# Patient Record
Sex: Male | Born: 1990
Health system: Southern US, Community
[De-identification: ages and names within clinical notes are randomized; demographics above are authoritative.]

## PROBLEM LIST (undated history)

## (undated) ENCOUNTER — Ambulatory Visit (HOSPITAL_COMMUNITY): Payer: BC Managed Care – PPO

---

## 2016-03-06 ENCOUNTER — Emergency Department (HOSPITAL_BASED_OUTPATIENT_CLINIC_OR_DEPARTMENT_OTHER): Payer: Self-pay

## 2016-03-06 ENCOUNTER — Encounter (HOSPITAL_BASED_OUTPATIENT_CLINIC_OR_DEPARTMENT_OTHER): Payer: Self-pay

## 2016-03-06 ENCOUNTER — Emergency Department (HOSPITAL_BASED_OUTPATIENT_CLINIC_OR_DEPARTMENT_OTHER)
Admission: EM | Admit: 2016-03-06 | Discharge: 2016-03-07 | Disposition: A | Discharge status: Home / Self Care | Payer: Self-pay | Attending: Emergency Medicine | Admitting: Emergency Medicine

## 2016-03-06 DIAGNOSIS — F12921 Cannabis use, unspecified with intoxication delirium: Secondary | ICD-10-CM

## 2016-03-06 DIAGNOSIS — Z5181 Encounter for therapeutic drug level monitoring: Secondary | ICD-10-CM | POA: Insufficient documentation

## 2016-03-06 DIAGNOSIS — F12121 Cannabis abuse with intoxication delirium: Secondary | ICD-10-CM | POA: Insufficient documentation

## 2016-03-06 LAB — RAPID URINE DRUG SCREEN, HOSP PERFORMED
Amphetamines: NOT DETECTED
BARBITURATES: NOT DETECTED
Benzodiazepines: NOT DETECTED
COCAINE: NOT DETECTED
OPIATES: NOT DETECTED
Tetrahydrocannabinol: POSITIVE — AB

## 2016-03-06 MED ORDER — ONDANSETRON HCL 4 MG/2ML IJ SOLN
4.0000 mg | Freq: Once | INTRAMUSCULAR | Status: AC
  Administered 2016-03-07: 4 mg via INTRAVENOUS
  Filled 2016-03-06: qty 2

## 2016-03-06 NOTE — ED Notes (Signed)
Pt 's parents received a phone call from patient's girlfriend tonight around 2100.  She received a phone call from pt who stated that he was sick and vomiting blood.  Parents arrived at his apartment and he was throwing up his dinner, pale and sweaty and was altered.  Upon further investigation in triage, pt may have eaten food that was laced with marijuana or another drug as he told parents something about taking "THC," and told nurse that he was "seeing things."  Pt is pale and c/o nausea, responds to voice and follows commands, but has vague recollection of events earlier tonight.  Otherwise healthy and not known to do drugs.

## 2016-03-06 NOTE — ED Provider Notes (Signed)
CSN: [id number]     Arrival date & time 03/06/16  2207 History  By signing my name below, I, Majel HomerPeyton Lee, attest that this documentation has been prepared under the direction and in the presence of Paula LibraJohn Keylani Perlstein, MD . Electronically Signed: Majel HomerPeyton Lee, Scribe. 03/06/2016. 11:17 PM.   Chief Complaint  Patient presents with  . Altered Mental Status   The history is provided by the patient. No language interpreter was used.   Level V Caveat due to AMS   HPI Comments: Steven Rangel is a 25 y.o. male who presents to the Emergency Department with parents with a complaint altered mental status first noticed ~9:00pm this evening. His mother got a phone call from patient's girlfriend tonight who stated that pt was vomiting and "didn't sound like himself." His mother states that when she called the patient he was barely responsive and stated that he was vomiting up blood. His twin brother went to check up on him and reported that he did not see blood in his vomit; it appeared to look more like chicken. The patient's mother noted he looked pale when she first approached him. She states he was unresponsive to most of the questions she asked him. He told his father that his friends were abusing THC earlier and thinks they may have slipped it into something he ate or drank. He has not expressed any thoughts of depression or suicidality.  History reviewed. No pertinent past medical history. History reviewed. No pertinent past surgical history. No family history on file. Social History  Substance Use Topics  . Smoking status: None  . Smokeless tobacco: None  . Alcohol Use: None    Review of Systems  Unable to perform ROS: Mental status change   Allergies  Review of patient's allergies indicates no known allergies.  Home Medications   Prior to Admission medications   Not on File   BP 139/79 mmHg  Pulse 84  Temp(Src) 98.5 F (36.9 C) (Oral)  Resp 16  Ht 5\' 11"  (1.803 m)  Wt 252 lb (114.306 kg)   BMI 35.16 kg/m2  SpO2 100%   Physical Exam General: Well-developed, well-nourished male in no acute distress; appearance consistent with age of record HENT: normocephalic; atraumatic Eyes: pupils equal, round and reactive to light; extraocular muscles grossly intact Neck: supple Heart: regular rate and rhythm Lungs: clear to auscultation bilaterally Abdomen: soft; nondistended; nontender; no masses or hepatosplenomegaly; bowel sounds present Extremities: No deformity; full range of motion; pulses normal, noted to move all extremities. Neurologic: Motor function intact in all extremities and symmetric; no facial droop; somnolent but arousable to voice; slow to answer but oriented x3 Skin: Warm and dry Psychiatric: Flat affect  ED Course  Procedures    MDM   Nursing notes and vitals signs, including pulse oximetry, reviewed.  Summary of this visit's results, reviewed by myself:  Labs:  Results for orders placed or performed during the hospital encounter of 03/06/16 (from the past 24 hour(s))  Urine rapid drug screen (hosp performed)     Status: Abnormal   Collection Time: 03/06/16 11:33 PM  Result Value Ref Range   Opiates NONE DETECTED NONE DETECTED   Cocaine NONE DETECTED NONE DETECTED   Benzodiazepines NONE DETECTED NONE DETECTED   Amphetamines NONE DETECTED NONE DETECTED   Tetrahydrocannabinol POSITIVE (A) NONE DETECTED   Barbiturates NONE DETECTED NONE DETECTED  CBC with Differential/Platelet     Status: Abnormal   Collection Time: 03/07/16  1:35 AM  Result Value Ref  Range   WBC 16.4 (H) 4.0 - 10.5 K/uL   RBC 4.85 4.22 - 5.81 MIL/uL   Hemoglobin 14.8 13.0 - 17.0 g/dL   HCT 16.141.9 09.639.0 - 04.552.0 %   MCV 86.4 78.0 - 100.0 fL   MCH 30.5 26.0 - 34.0 pg   MCHC 35.3 30.0 - 36.0 g/dL   RDW 40.912.5 81.111.5 - 91.415.5 %   Platelets 256 150 - 400 K/uL   Neutrophils Relative % 89 %   Neutro Abs 14.6 (H) 1.7 - 7.7 K/uL   Lymphocytes Relative 7 %   Lymphs Abs 1.1 0.7 - 4.0 K/uL    Monocytes Relative 4 %   Monocytes Absolute 0.6 0.1 - 1.0 K/uL   Eosinophils Relative 0 %   Eosinophils Absolute 0.0 0.0 - 0.7 K/uL   Basophils Relative 0 %   Basophils Absolute 0.0 0.0 - 0.1 K/uL  Comprehensive metabolic panel     Status: None   Collection Time: 03/07/16  1:35 AM  Result Value Ref Range   Sodium 136 135 - 145 mmol/L   Potassium 4.3 3.5 - 5.1 mmol/L   Chloride 103 101 - 111 mmol/L   CO2 27 22 - 32 mmol/L   Glucose, Bld 98 65 - 99 mg/dL   BUN 12 6 - 20 mg/dL   Creatinine, Ser 7.821.12 0.61 - 1.24 mg/dL   Calcium 9.3 8.9 - 95.610.3 mg/dL   Total Protein 7.4 6.5 - 8.1 g/dL   Albumin 4.4 3.5 - 5.0 g/dL   AST 23 15 - 41 U/L   ALT 44 17 - 63 U/L   Alkaline Phosphatase 49 38 - 126 U/L   Total Bilirubin 0.8 0.3 - 1.2 mg/dL   GFR calc non Af Amer >60 >60 mL/min   GFR calc Af Amer >60 >60 mL/min   Anion gap 6 5 - 15  Ethanol     Status: None   Collection Time: 03/07/16  1:35 AM  Result Value Ref Range   Alcohol, Ethyl (B) <5 <5 mg/dL    Imaging Studies: Ct Head Wo Contrast  03/07/2016  CLINICAL DATA:  Altered level of consciousness. EXAM: CT HEAD WITHOUT CONTRAST TECHNIQUE: Contiguous axial images were obtained from the base of the skull through the vertex without intravenous contrast. COMPARISON:  None. FINDINGS: Ventricle size is normal. Negative for acute or chronic infarction. Negative for hemorrhage or fluid collection. Negative for mass or edema. No shift of the midline structures. Calvarium is intact. IMPRESSION: Negative Electronically Signed   By: Marlan Palauharles  Clark M.D.   On: 03/07/2016 00:02   2:08 AM Patient now more arousable and conversant. He and his parents were advised of lab findings consistent with acute THC intoxication.  Final diagnoses:  Intoxication with cannabis, with delirium (HCC)   I personally performed the services described in this documentation, which was scribed in my presence. The recorded information has been reviewed and is accurate.   Paula LibraJohn  Mayla Biddy, MD 03/07/16 [phone]

## 2016-03-07 LAB — CBC WITH DIFFERENTIAL/PLATELET
Basophils Absolute: 0 10*3/uL (ref 0.0–0.1)
Basophils Relative: 0 %
EOS ABS: 0 10*3/uL (ref 0.0–0.7)
Eosinophils Relative: 0 %
HEMATOCRIT: 41.9 % (ref 39.0–52.0)
HEMOGLOBIN: 14.8 g/dL (ref 13.0–17.0)
LYMPHS ABS: 1.1 10*3/uL (ref 0.7–4.0)
Lymphocytes Relative: 7 %
MCH: 30.5 pg (ref 26.0–34.0)
MCHC: 35.3 g/dL (ref 30.0–36.0)
MCV: 86.4 fL (ref 78.0–100.0)
MONO ABS: 0.6 10*3/uL (ref 0.1–1.0)
MONOS PCT: 4 %
NEUTROS ABS: 14.6 10*3/uL — AB (ref 1.7–7.7)
NEUTROS PCT: 89 %
Platelets: 256 10*3/uL (ref 150–400)
RBC: 4.85 MIL/uL (ref 4.22–5.81)
RDW: 12.5 % (ref 11.5–15.5)
WBC: 16.4 10*3/uL — ABNORMAL HIGH (ref 4.0–10.5)

## 2016-03-07 LAB — COMPREHENSIVE METABOLIC PANEL
ALK PHOS: 49 U/L (ref 38–126)
ALT: 44 U/L (ref 17–63)
ANION GAP: 6 (ref 5–15)
AST: 23 U/L (ref 15–41)
Albumin: 4.4 g/dL (ref 3.5–5.0)
BUN: 12 mg/dL (ref 6–20)
CALCIUM: 9.3 mg/dL (ref 8.9–10.3)
CO2: 27 mmol/L (ref 22–32)
Chloride: 103 mmol/L (ref 101–111)
Creatinine, Ser: 1.12 mg/dL (ref 0.61–1.24)
Glucose, Bld: 98 mg/dL (ref 65–99)
Potassium: 4.3 mmol/L (ref 3.5–5.1)
Sodium: 136 mmol/L (ref 135–145)
TOTAL PROTEIN: 7.4 g/dL (ref 6.5–8.1)
Total Bilirubin: 0.8 mg/dL (ref 0.3–1.2)

## 2016-03-07 LAB — ETHANOL

## 2017-05-20 IMAGING — CT CT HEAD W/O CM
3 series · 15 of 47 positions shown, 18 images · non-contrast
Comparison: None.

CLINICAL DATA: Altered level of consciousness.

EXAM:
CT HEAD WITHOUT CONTRAST
TECHNIQUE: Contiguous axial images were obtained from the base of the skull
through the vertex without intravenous contrast.

[Series 2: head wo · axial · 0.46mm/px · z∈[+1129,+1254]mm · 9 of 31 slices shown, 12 images]
[im 3/31  brain]
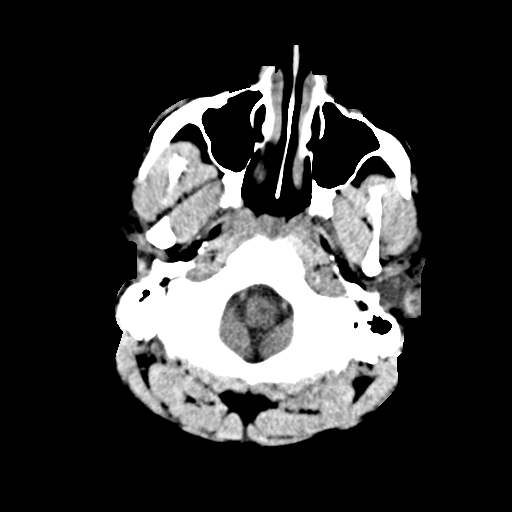
[im 3/31  bone]
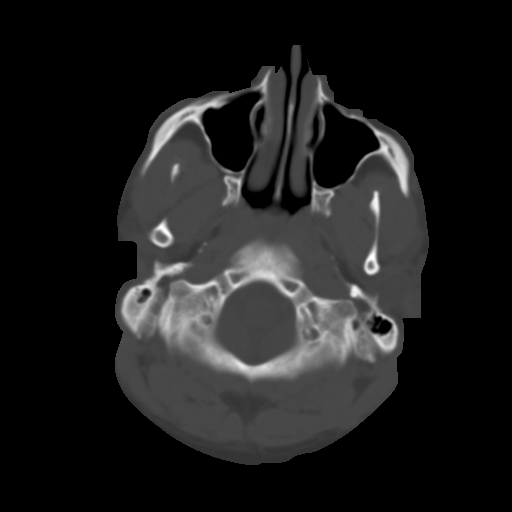
[im 6/31  brain]
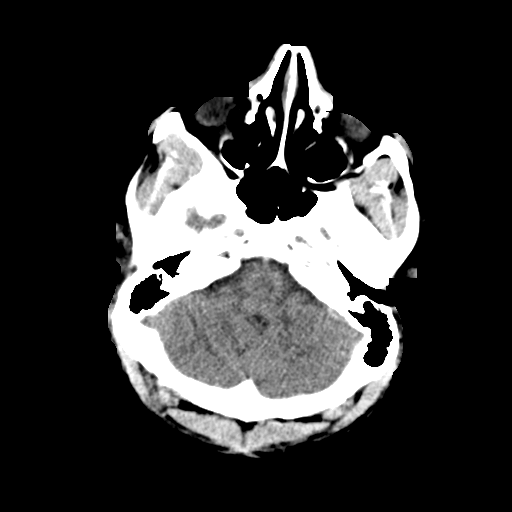
[im 9/31  brain]
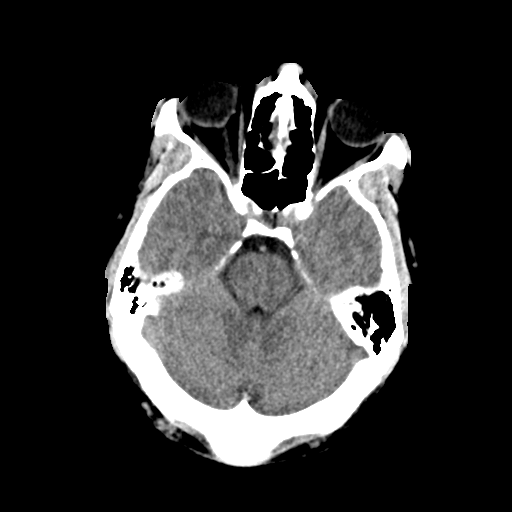
[im 12/31  brain]
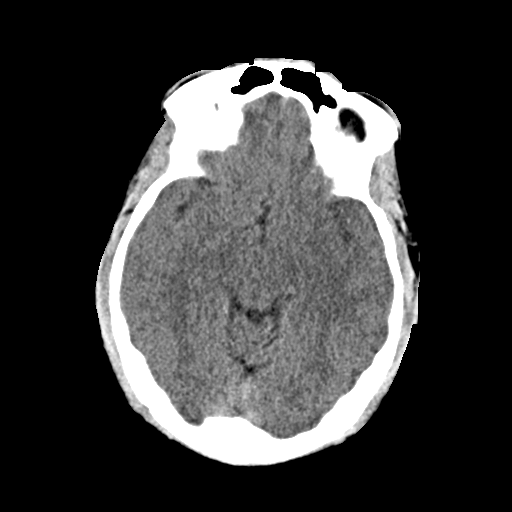
[im 16/31  brain]
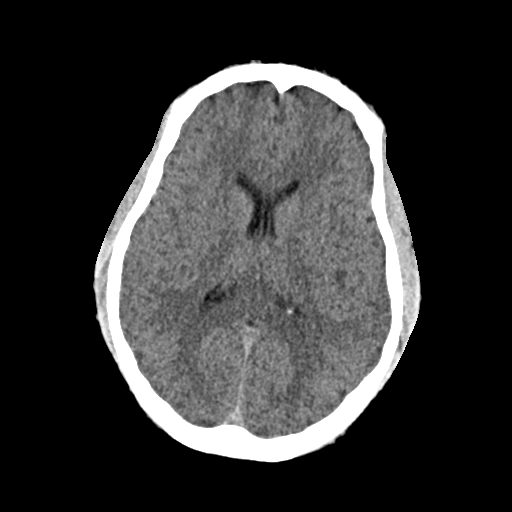
[im 16/31  bone]
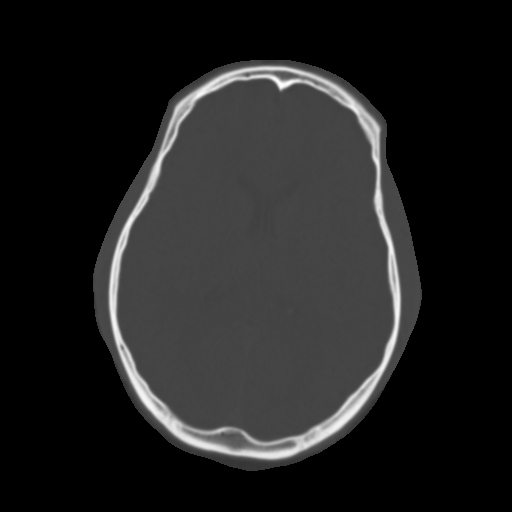
[im 19/31  brain]
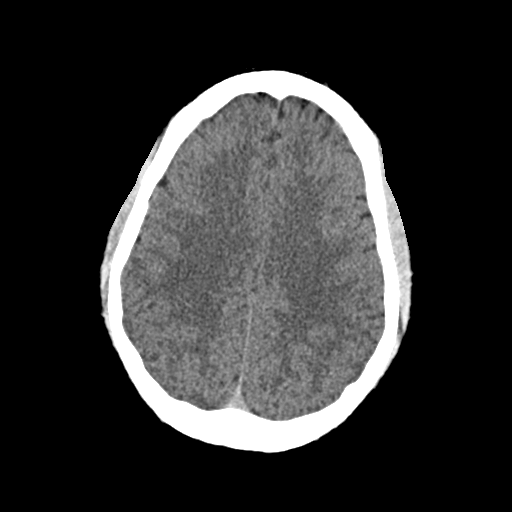
[im 22/31  brain]
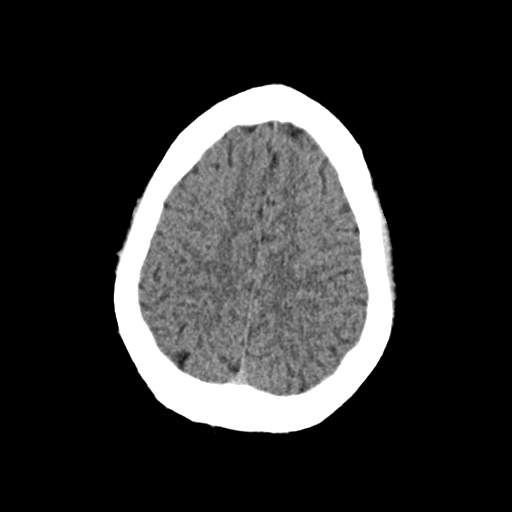
[im 25/31  brain]
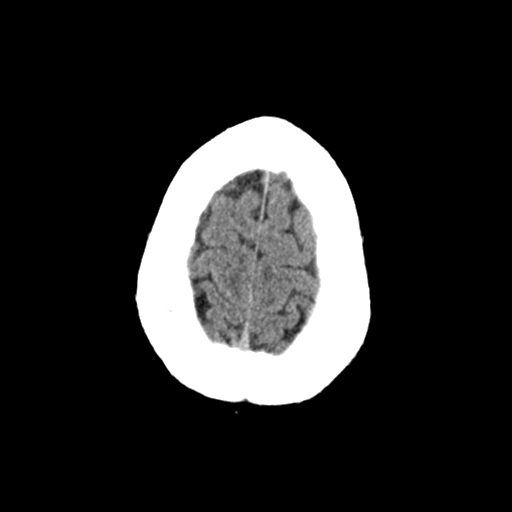
[im 28/31  brain]
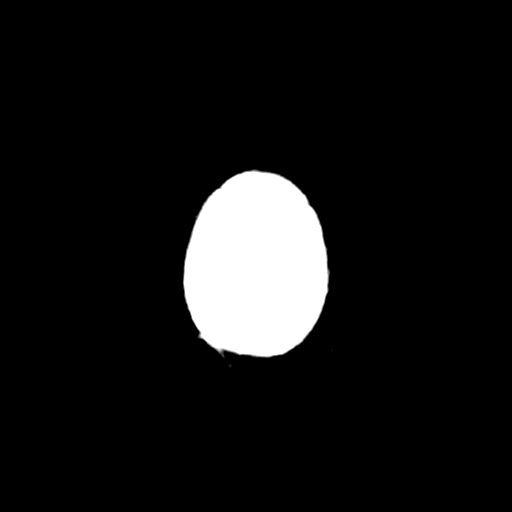
[im 28/31  bone]
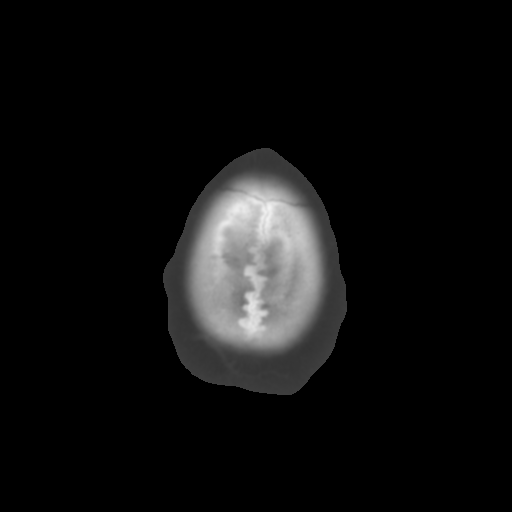

[Series 4: coronal soft · coronal · 0.30mm/px · 3 of 70 slices shown]
[im 24/70  brain]
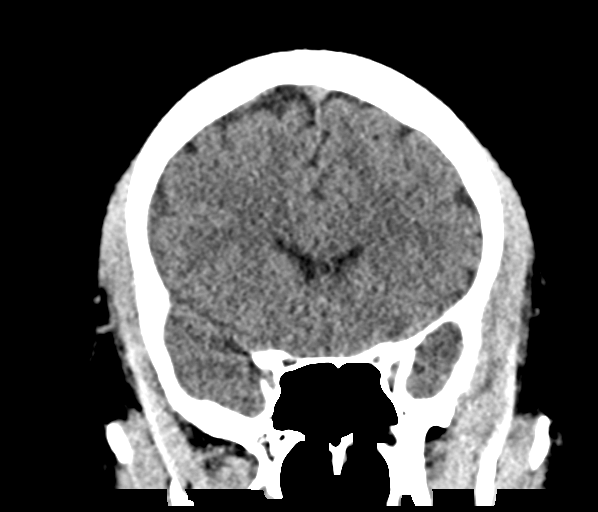
[im 31/70  brain]
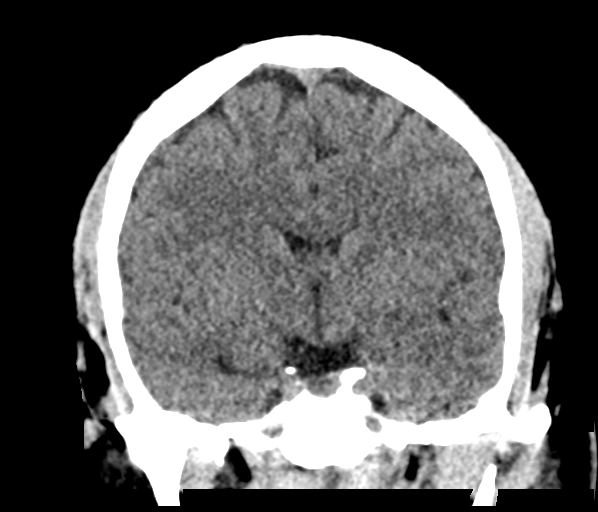
[im 39/70  brain]
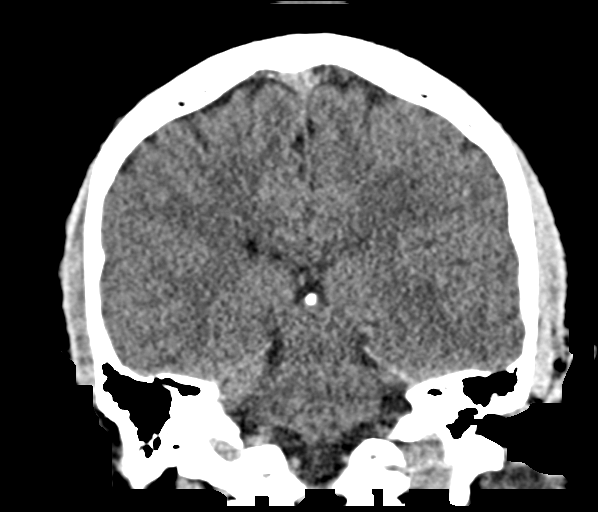

[Series 5: sag soft · sagittal · 0.33mm/px · 3 of 67 slices shown]
[im 23/67  brain]
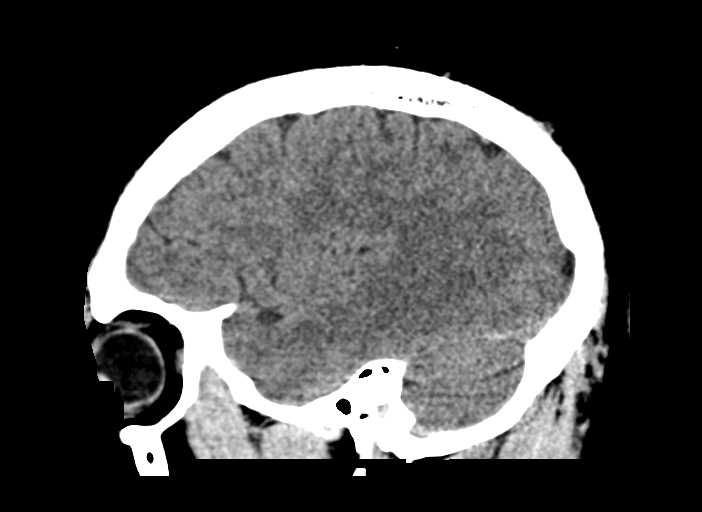
[im 34/67  brain]
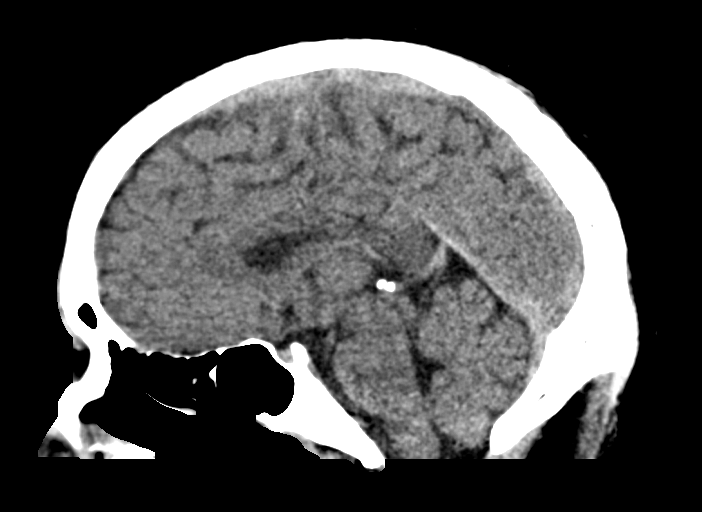
[im 45/67  brain]
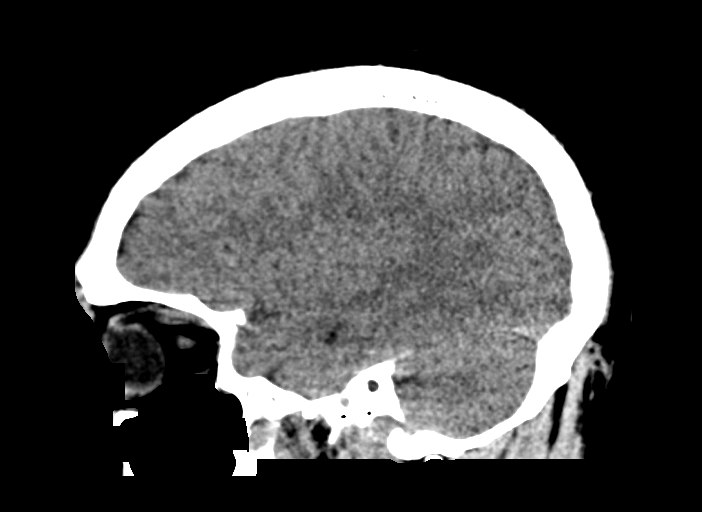

[15 of 47 positions shown; findings below may reference images not displayed]

FINDINGS: Ventricle size is normal. Negative for acute or chronic infarction.
Negative for hemorrhage or fluid collection. Negative for mass or
edema. No shift of the midline structures.

Calvarium is intact.
IMPRESSION: Negative

## 2018-10-20 ENCOUNTER — Other Ambulatory Visit: Payer: Self-pay

## 2018-10-20 ENCOUNTER — Encounter (HOSPITAL_BASED_OUTPATIENT_CLINIC_OR_DEPARTMENT_OTHER): Payer: Self-pay | Admitting: Emergency Medicine

## 2018-10-20 ENCOUNTER — Emergency Department (HOSPITAL_BASED_OUTPATIENT_CLINIC_OR_DEPARTMENT_OTHER)
Admission: EM | Admit: 2018-10-20 | Discharge: 2018-10-20 | Disposition: A | Discharge status: Home / Self Care | Payer: BLUE CROSS/BLUE SHIELD | Attending: Emergency Medicine | Admitting: Emergency Medicine

## 2018-10-20 ENCOUNTER — Emergency Department (HOSPITAL_BASED_OUTPATIENT_CLINIC_OR_DEPARTMENT_OTHER): Payer: BLUE CROSS/BLUE SHIELD

## 2018-10-20 DIAGNOSIS — Y939 Activity, unspecified: Secondary | ICD-10-CM | POA: Insufficient documentation

## 2018-10-20 DIAGNOSIS — S4991XA Unspecified injury of right shoulder and upper arm, initial encounter: Secondary | ICD-10-CM | POA: Diagnosis present

## 2018-10-20 DIAGNOSIS — Y929 Unspecified place or not applicable: Secondary | ICD-10-CM | POA: Insufficient documentation

## 2018-10-20 DIAGNOSIS — X500XXA Overexertion from strenuous movement or load, initial encounter: Secondary | ICD-10-CM | POA: Diagnosis not present

## 2018-10-20 DIAGNOSIS — S46911A Strain of unspecified muscle, fascia and tendon at shoulder and upper arm level, right arm, initial encounter: Secondary | ICD-10-CM | POA: Insufficient documentation

## 2018-10-20 DIAGNOSIS — Y999 Unspecified external cause status: Secondary | ICD-10-CM | POA: Insufficient documentation

## 2018-10-20 MED ORDER — METHOCARBAMOL 500 MG PO TABS: 500.0000 mg | ORAL_TABLET | Freq: Two times a day (BID) | ORAL | 0 refills | Status: AC

## 2018-10-20 MED FILL — METHOCARBAMOL 500 MG TABLET: 500 | 10 days supply | Qty: 20 | Fill #0

## 2018-10-20 NOTE — ED Provider Notes (Signed)
[facility] EMERGENCY DEPARTMENT Provider Note   CSN: [id number] Arrival date & time: 10/20/18  1141     History   Chief Complaint Chief Complaint  Patient presents with  . Shoulder Injury    HPI Gavyn Trombly is a 28 y.o. male who presents with right shoulder pain.  No significant past medical history.  The patient states that he was doing bench presses last night.  He states that he just started working out again after some time off.  When he was doing the bench press he felt acute onset of pain in the left shoulder area over the trapezius.  Pain is worse with movement of the arm.  Nothing makes it better.  He has not tried anything over-the-counter.  He has not had this problem before.  He is able to move his shoulder but is limited due to pain.  He has some numbness and tingling over the trapezius.  No radicular pain or severe neck pain.  No weakness of the arm  HPI  History reviewed. No pertinent past medical history.  There are no active problems to display for this patient.   History reviewed. No pertinent surgical history.      Home Medications    Prior to Admission medications   Not on File    Family History History reviewed. No pertinent family history.  Social History Social History   Tobacco Use  . Smoking status: Never Smoker  . Smokeless tobacco: Never Used  Substance Use Topics  . Alcohol use: Not on file  . Drug use: Not on file     Allergies   Patient has no known allergies.   Review of Systems Review of Systems  Musculoskeletal: Positive for arthralgias and myalgias.  Neurological: Positive for numbness. Negative for weakness.     Physical Exam Updated Vital Signs BP (!) 149/91 (BP Location: Left Arm)   Pulse 77   Temp 98.5 F (36.9 C) (Oral)   Resp 18   Ht 5\' 11"  (1.803 m)   Wt 106.6 kg   SpO2 100%   BMI 32.78 kg/m   Physical Exam Vitals signs and nursing note reviewed.  Constitutional:      General: He is  not in acute distress.    Appearance: Normal appearance. He is well-developed.  HENT:     Head: Normocephalic and atraumatic.  Eyes:     General: No scleral icterus.       Right eye: No discharge.        Left eye: No discharge.     Conjunctiva/sclera: Conjunctivae normal.     Pupils: Pupils are equal, round, and reactive to light.  Neck:     Musculoskeletal: Normal range of motion.  Cardiovascular:     Rate and Rhythm: Normal rate and regular rhythm.  Pulmonary:     Effort: Pulmonary effort is normal. No respiratory distress.     Breath sounds: Normal breath sounds.  Abdominal:     General: There is no distension.  Musculoskeletal:     Comments: Right shoulder: Right trapezius feels tight and swollen. There is some spasms with flexion of the shoulder. Tenderness to palpation over left trapezius. Mild tenderness over biceps tendon. No tenderness over lateral shoulder. Negative drop arm test. 5/5 grip strength. N/V intact.   Skin:    General: Skin is warm and dry.  Neurological:     Mental Status: He is alert and oriented to person, place, and time.  Psychiatric:  Behavior: Behavior normal.      ED Treatments / Results  Labs (all labs ordered are listed, but only abnormal results are displayed) Labs Reviewed - No data to display  EKG None  Radiology Dg Shoulder Right  Result Date: 10/20/2018 CLINICAL DATA:  Patient states that he was lifting weights early this morning and injured his right shoulder, having pains to entire right shoulder, no other complaints EXAM: RIGHT SHOULDER - 2+ VIEW COMPARISON:  None. FINDINGS: There is no evidence of fracture or dislocation. There is no evidence of arthropathy or other focal bone abnormality. Soft tissues are unremarkable. IMPRESSION: Negative. Electronically Signed   By: Amie Portland M.D.   On: 10/20/2018 12:27    Procedures Procedures (including critical care time)  Medications Ordered in ED Medications - No data to  display   Initial Impression / Assessment and Plan / ED Course  I have reviewed the triage vital signs and the nursing notes.  Pertinent labs & imaging results that were available during my care of the patient were reviewed by me and considered in my medical decision making (see chart for details).  28 year old male presents with right shoulder pain after lifting injury yesterday.  On exam he has full range of motion of the shoulder however he is having significant pain.  Suspect muscle strain.  His x-ray is negative.  Advised to rest, ice, heat, NSAIDs and will prescribe a course of muscle relaxers.  He was given sports medicine follow-up if he is not improving.  Final Clinical Impressions(s) / ED Diagnoses   Final diagnoses:  Strain of right shoulder, initial encounter    ED Discharge Orders    None       Bethel Born, PA-C 10/20/18 1340    Vanetta Mulders, MD 10/24/18 [phone]

## 2018-10-20 NOTE — ED Triage Notes (Signed)
Reports right shoulder injury while lifting weights yesterday.

## 2018-10-20 NOTE — Discharge Instructions (Addendum)
Rest - avoid working out or aggravating movements until you do not have pain Ice - for the first 24 hours for 20 minutes at a time and then switch to heat Ibuprofen - take 600mg  three times a day. Take with food Take muscle relaxer as needed for spasms. Do not drive or take while working Follow up with orthopedics if you are not improving in the next week

## 2018-10-20 NOTE — ED Notes (Signed)
Pt enrolled in aromatherapy pain trial 

## 2021-04-09 ENCOUNTER — Encounter (HOSPITAL_BASED_OUTPATIENT_CLINIC_OR_DEPARTMENT_OTHER): Payer: Self-pay | Admitting: Emergency Medicine

## 2021-04-09 ENCOUNTER — Emergency Department (HOSPITAL_BASED_OUTPATIENT_CLINIC_OR_DEPARTMENT_OTHER)
Admission: EM | Admit: 2021-04-09 | Discharge: 2021-04-09 | Disposition: A | Discharge status: Home / Self Care | Payer: BC Managed Care – PPO | Attending: Emergency Medicine | Admitting: Emergency Medicine

## 2021-04-09 ENCOUNTER — Other Ambulatory Visit: Payer: Self-pay

## 2021-04-09 ENCOUNTER — Other Ambulatory Visit (HOSPITAL_BASED_OUTPATIENT_CLINIC_OR_DEPARTMENT_OTHER): Payer: Self-pay

## 2021-04-09 DIAGNOSIS — L500 Allergic urticaria: Secondary | ICD-10-CM | POA: Insufficient documentation

## 2021-04-09 DIAGNOSIS — T63441A Toxic effect of venom of bees, accidental (unintentional), initial encounter: Secondary | ICD-10-CM | POA: Diagnosis not present

## 2021-04-09 MED ORDER — ONDANSETRON HCL 4 MG/2ML IJ SOLN
4.0000 mg | Freq: Once | INTRAMUSCULAR | Status: AC
  Administered 2021-04-09: 4 mg via INTRAVENOUS
  Filled 2021-04-09: qty 2

## 2021-04-09 MED ORDER — DIPHENHYDRAMINE HCL 50 MG/ML IJ SOLN
25.0000 mg | Freq: Once | INTRAMUSCULAR | Status: AC
  Administered 2021-04-09: 25 mg via INTRAVENOUS
  Filled 2021-04-09: qty 1

## 2021-04-09 MED ORDER — METHYLPREDNISOLONE SODIUM SUCC 125 MG IJ SOLR
125.0000 mg | Freq: Once | INTRAMUSCULAR | Status: AC
  Administered 2021-04-09: 125 mg via INTRAVENOUS
  Filled 2021-04-09: qty 2

## 2021-04-09 MED ORDER — ONDANSETRON 4 MG PO TBDP
4.0000 mg | ORAL_TABLET | Freq: Three times a day (TID) | ORAL | 0 refills | Status: AC | PRN
  Filled 2021-04-09: qty 15, 5d supply, fill #0

## 2021-04-09 MED ORDER — KETOROLAC TROMETHAMINE 30 MG/ML IJ SOLN
30.0000 mg | Freq: Once | INTRAMUSCULAR | Status: AC
  Administered 2021-04-09: 30 mg via INTRAVENOUS
  Filled 2021-04-09: qty 1

## 2021-04-09 NOTE — ED Provider Notes (Signed)
[facility] EMERGENCY DEPARTMENT Provider Note   CSN: [id number] Arrival date & time: 04/09/21  8657     History Chief Complaint  Patient presents with   Allergic Reaction    Steven Rangel is a 30 y.o. male presenting to the emergency department after being stung by hornets nest.  The patient ports his car around 930.  He said he was stung multiple times over the body by yellow jackets hornets.  He was out weeding with his dad.  He reports he has been stung in the past, denies any known allergies to bee stings or anything else.  He denies any throat tightness, difficulty speaking, difficulty breathing.  He reports he is got a painful and itchy rash on his body.  No other medical problems.  He did take an antihistamine prior to arriving, but cannot recall the name of it.  HPI     History reviewed. No pertinent past medical history.  There are no problems to display for this patient.   History reviewed. No pertinent surgical history.     History reviewed. No pertinent family history.  Social History   Tobacco Use   Smoking status: Never   Smokeless tobacco: Never    Home Medications Prior to Admission medications   Medication Sig Start Date End Date Taking? Authorizing Provider  ondansetron (ZOFRAN ODT) 4 MG disintegrating tablet Take 1 tablet (4 mg total) by mouth every 8 (eight) hours as needed for up to 15 doses for nausea or vomiting. 04/09/21  Yes Doneta Bayman, Kermit Balo, MD  methocarbamol (ROBAXIN) 500 MG tablet Take 1 tablet (500 mg total) by mouth 2 (two) times daily. 10/20/18   Bethel Born, PA-C    Allergies    Patient has no known allergies.  Review of Systems   Review of Systems  Constitutional:  Negative for chills and fever.  Eyes:  Negative for pain and visual disturbance.  Respiratory:  Negative for cough and shortness of breath.   Cardiovascular:  Negative for chest pain and palpitations.  Gastrointestinal:  Negative for abdominal pain and  vomiting.  Genitourinary:  Negative for dysuria and hematuria.  Musculoskeletal:  Negative for arthralgias and back pain.  Skin:  Positive for rash. Negative for color change.  Neurological:  Negative for syncope and headaches.  All other systems reviewed and are negative.  Physical Exam Updated Vital Signs BP 126/86 (BP Location: Right Arm)   Pulse 93   Temp 98 F (36.7 C) (Oral)   Resp 20   Ht 5\' 10"  (1.778 m)   Wt 104.3 kg   SpO2 99%   BMI 33.00 kg/m   Physical Exam Constitutional:      General: He is not in acute distress. HENT:     Head: Normocephalic and atraumatic.  Eyes:     Conjunctiva/sclera: Conjunctivae normal.     Pupils: Pupils are equal, round, and reactive to light.  Cardiovascular:     Rate and Rhythm: Normal rate and regular rhythm.  Pulmonary:     Effort: Pulmonary effort is normal. No respiratory distress.     Comments: Oropharynx non-erythematous.  No tonsillar swelling or exudate.  No uvular deviation.  No drooling. No brawny edema. No stridor. Voice is not muffled.   Abdominal:     General: There is no distension.     Tenderness: There is no abdominal tenderness.  Skin:    General: Skin is warm and dry.     Comments: Hives on arms and legs  Neurological:     General: No focal deficit present.     Mental Status: He is alert. Mental status is at baseline.  Psychiatric:        Mood and Affect: Mood normal.        Behavior: Behavior normal.    ED Results / Procedures / Treatments   Labs (all labs ordered are listed, but only abnormal results are displayed) Labs Reviewed - No data to display  EKG EKG Interpretation  Date/Time:  Wednesday April 09 2021 10:03:25 EDT Ventricular Rate:  111 PR Interval:  139 QRS Duration: 95 QT Interval:  315 QTC Calculation: 428 R Axis:   70 Text Interpretation: Sinus tachycardia Borderline T abnormalities, diffuse leads Confirmed by Alvester Chou [phone]) on 04/09/2021 10:27:29 AM  Radiology No  results found.  Procedures Procedures   Medications Ordered in ED Medications  ketorolac (TORADOL) 30 MG/ML injection 30 mg (30 mg Intravenous Given 04/09/21 1019)  diphenhydrAMINE (BENADRYL) injection 25 mg (25 mg Intravenous Given 04/09/21 1020)  methylPREDNISolone sodium succinate (SOLU-MEDROL) 125 mg/2 mL injection 125 mg (125 mg Intravenous Given 04/09/21 1019)  ondansetron (ZOFRAN) injection 4 mg (4 mg Intravenous Given 04/09/21 1126)    ED Course  I have reviewed the triage vital signs and the nursing notes.  Pertinent labs & imaging results that were available during my care of the patient were reviewed by me and considered in my medical decision making (see chart for details).  Patient presents ED with an isolated or localized allergic reaction, hives only, no evidence of anaphylaxis, no evidence of airway involvement or swelling.  I have ordered IV Benadryl and IV steroids to be given.  We will monitor him in the ED.  Clinical Course as of 04/09/21 1649  Wed Apr 09, 2021  1118 His labs are improving, his heart rate is now 100.  However he does feel nauseated.  I will order some Zofran. [MT]  1145 HR normalized [MT]  1319 Remains asymptomatic.  Blood pressure stable.  Heart rate is improved.  His hives and rash is resolved.  No evidence of oral involvement.  Stable for discharge. [MT]    Clinical Course User Index [MT] Sloane Junkin, Kermit Balo, MD   Final Clinical Impression(s) / ED Diagnoses Final diagnoses:  Bee sting reaction, accidental or unintentional, initial encounter    Rx / DC Orders ED Discharge Orders          Ordered    ondansetron (ZOFRAN ODT) 4 MG disintegrating tablet  Every 8 hours PRN        04/09/21 1318             Terald Sleeper, MD 04/09/21 1649

## 2021-04-09 NOTE — ED Triage Notes (Signed)
Pt arrives pov with c/o multiple bee stings pta by yellow jackets. Pt has facial swelling, hives noted, multiple stings to neck and torso. Pt denies difficulty breathing, ambulatory to triage

## 2021-04-09 NOTE — Discharge Instructions (Addendum)
If you continue having itching or rash for the next 2 days you can take Benadryl 25 mg every 6 hours.
